# Patient Record
Sex: Male | Born: 1985 | Hispanic: Yes | Marital: Single | Smoking: Never smoker
Health system: Southern US, Community
[De-identification: ages and names within clinical notes are randomized; demographics above are authoritative.]

---
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
## 2008-10-05 IMAGING — CR DG SHOULDER 2+V*R*
3 series · 3 of 3 positions shown · non-contrast
Comparison: none

CLINICAL DATA: Trampoline injury with shoulder pain.

RIGHT SHOULDER - 3 VIEW

[w shoulder ap internal righ]
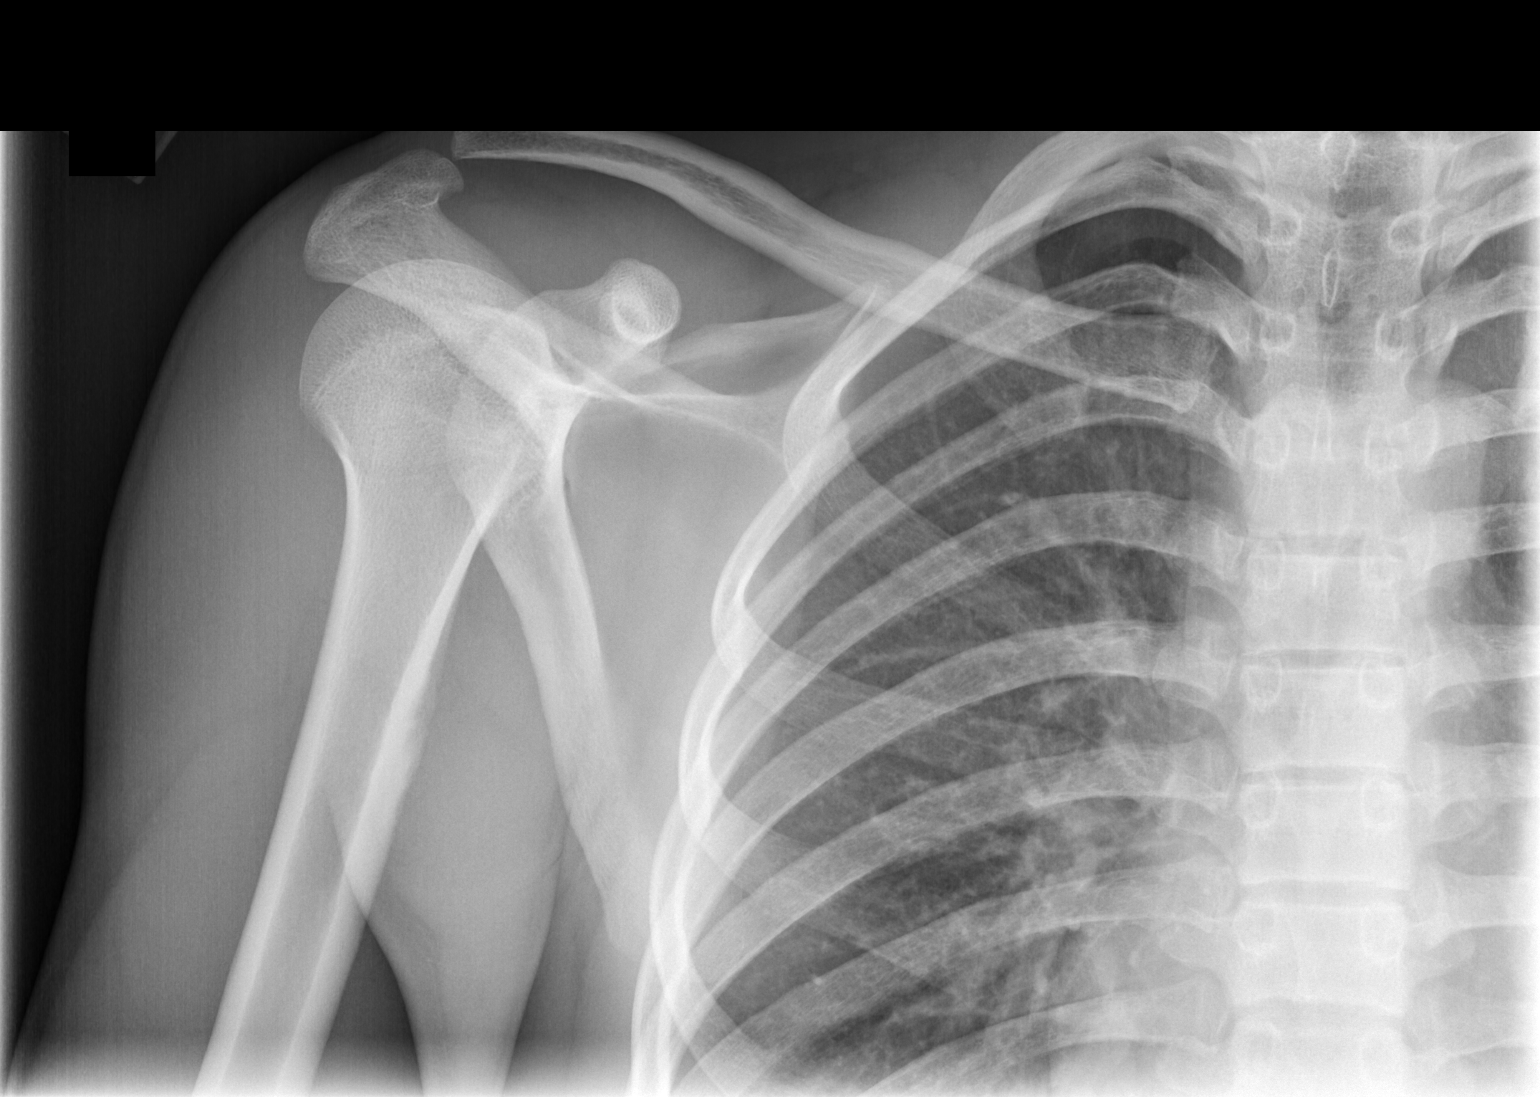

[w shoulder ap external righ]
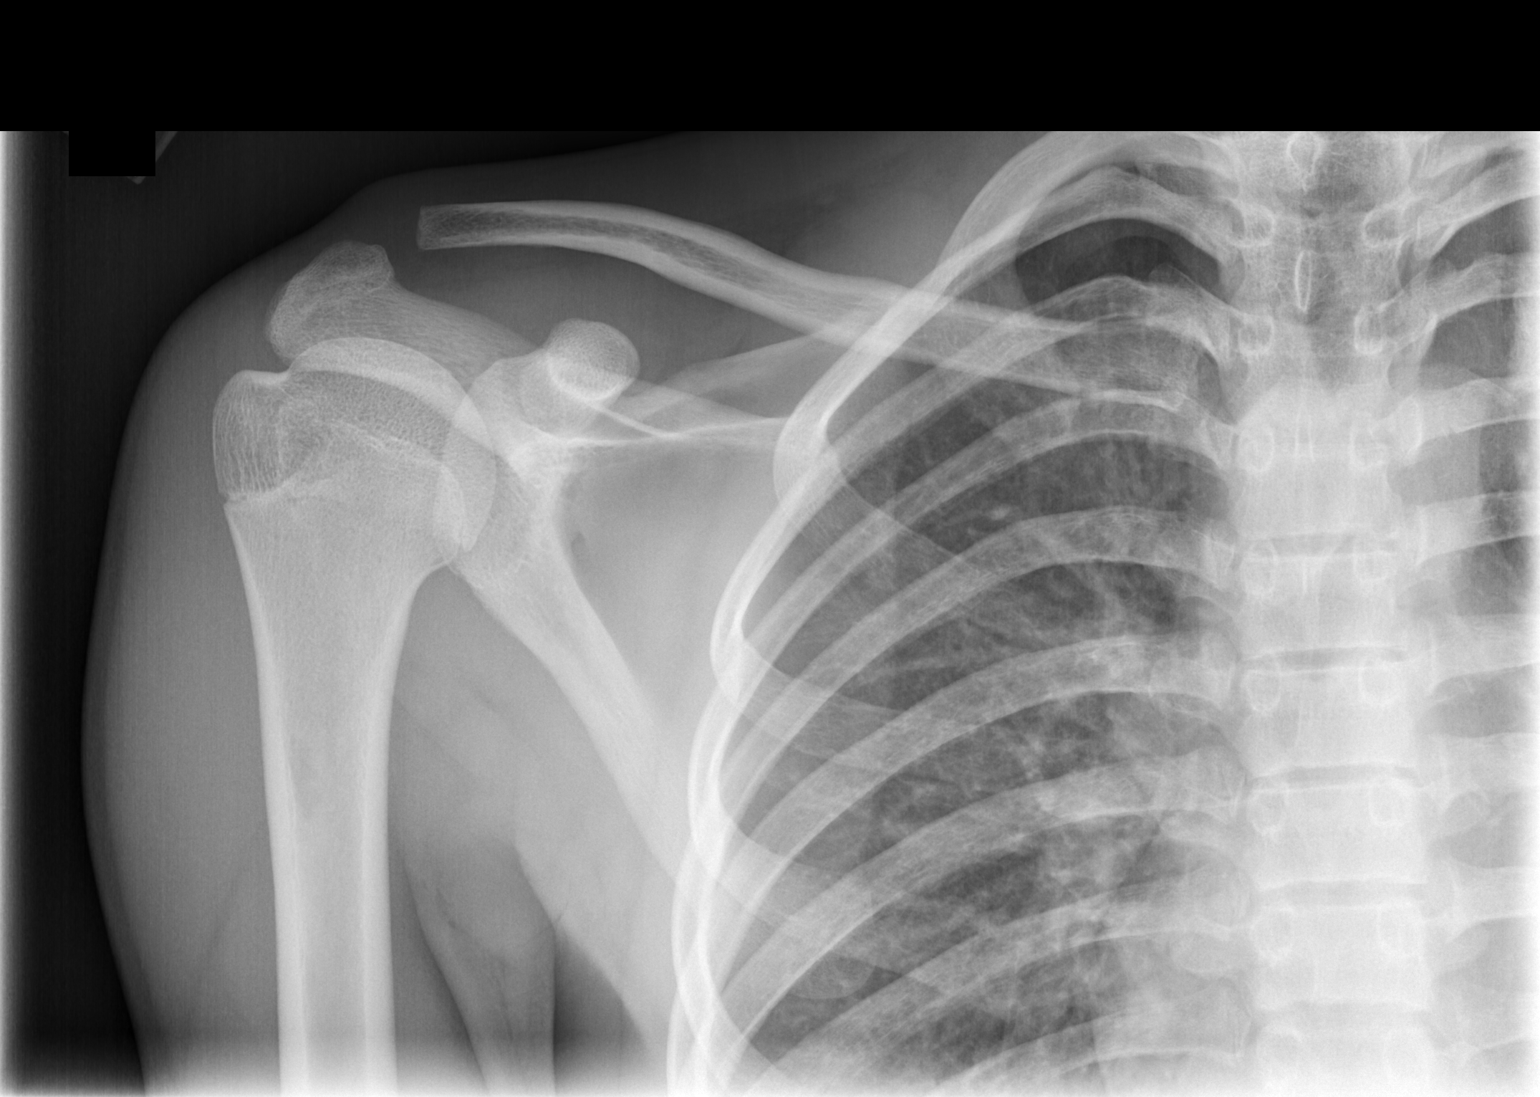

[w shoulder y view right]
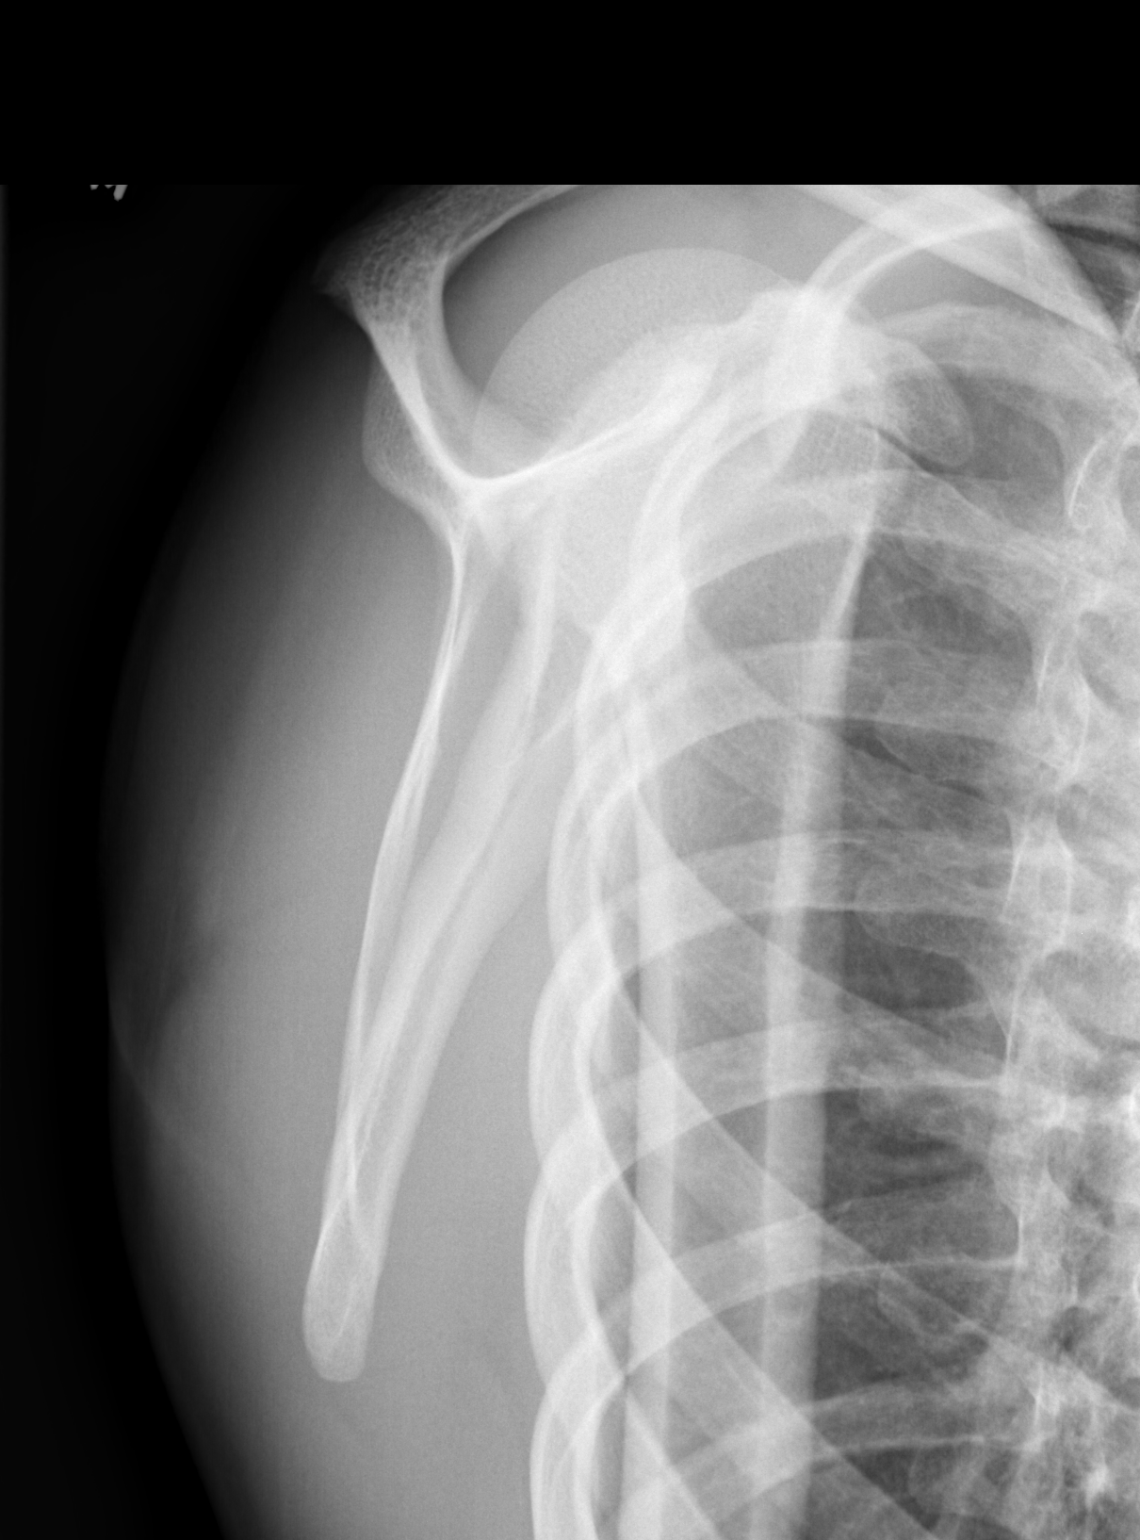

[3 of 3 positions shown; findings below may reference images not displayed]

FINDINGS: There appears to be an abnormal relationship between the clavicle and
the acromion, with the clavicle somewhat higher than the adjacent acromion. This
may reflect Bhebhe, Rantona, or IV separation. Weight-bearing views which
include the bilateral AC joints could be helpful for confirmation and further
characterization.

IMPRESSION

1. Poor alignment between the clavicle and acromion, compatible with AC joint
separation.  The coracoclavicular distances 14 mm, and thus I am uncertain
whether the coracoclavicular ligament is torn. Weight-bearing views may be
helpful for further evaluation, if clinically warranted. Correlate with physical
exam findings in assessing how posterior the distal clavicle is with respect the
acromion, to exclude Saihou Machiels IV separation.

## 2010-07-23 ENCOUNTER — Emergency Department (HOSPITAL_COMMUNITY)
Admission: EM | Admit: 2010-07-23 | Discharge: 2010-07-23 | Disposition: A | Discharge status: Home / Self Care | Payer: BC Managed Care – PPO | Attending: Emergency Medicine | Admitting: Emergency Medicine

## 2010-07-23 ENCOUNTER — Emergency Department (HOSPITAL_COMMUNITY): Payer: BC Managed Care – PPO

## 2010-07-23 DIAGNOSIS — S02109A Fracture of base of skull, unspecified side, initial encounter for closed fracture: Secondary | ICD-10-CM | POA: Insufficient documentation

## 2010-07-23 DIAGNOSIS — W219XXA Striking against or struck by unspecified sports equipment, initial encounter: Secondary | ICD-10-CM | POA: Insufficient documentation

## 2010-07-23 DIAGNOSIS — S0510XA Contusion of eyeball and orbital tissues, unspecified eye, initial encounter: Secondary | ICD-10-CM | POA: Insufficient documentation

## 2010-07-23 DIAGNOSIS — R040 Epistaxis: Secondary | ICD-10-CM | POA: Insufficient documentation

## 2010-07-23 DIAGNOSIS — H5710 Ocular pain, unspecified eye: Secondary | ICD-10-CM | POA: Insufficient documentation

## 2010-07-23 DIAGNOSIS — Y9366 Activity, soccer: Secondary | ICD-10-CM | POA: Insufficient documentation

## 2010-07-23 DIAGNOSIS — R220 Localized swelling, mass and lump, head: Secondary | ICD-10-CM | POA: Insufficient documentation

## 2010-07-23 DIAGNOSIS — Y9239 Other specified sports and athletic area as the place of occurrence of the external cause: Secondary | ICD-10-CM | POA: Insufficient documentation

## 2014-05-11 ENCOUNTER — Emergency Department (INDEPENDENT_AMBULATORY_CARE_PROVIDER_SITE_OTHER)
Admission: EM | Admit: 2014-05-11 | Discharge: 2014-05-11 | Disposition: A | Discharge status: Home / Self Care | Payer: Self-pay | Source: Home / Self Care | Attending: Family Medicine | Admitting: Family Medicine

## 2014-05-11 ENCOUNTER — Encounter (HOSPITAL_COMMUNITY): Payer: Self-pay | Admitting: Emergency Medicine

## 2014-05-11 DIAGNOSIS — J111 Influenza due to unidentified influenza virus with other respiratory manifestations: Secondary | ICD-10-CM

## 2014-05-11 DIAGNOSIS — R69 Illness, unspecified: Principal | ICD-10-CM

## 2014-05-11 MED ORDER — TRAMADOL HCL 50 MG PO TABS: 50.0000 mg | ORAL_TABLET | Freq: Every evening | ORAL | Status: AC | PRN

## 2014-05-11 MED ORDER — NAPROXEN 500 MG PO TABS: 500.0000 mg | ORAL_TABLET | Freq: Two times a day (BID) | ORAL | Status: AC

## 2014-05-11 NOTE — Discharge Instructions (Signed)
Thank you for coming in today. Call or go to the emergency room if you get worse, have trouble breathing, have chest pains, or palpitations.   Gripe (Influenza) La gripe es una infeccin viral del tracto respiratorio. Ocurre con ms frecuencia en los meses de invierno, ya que las personas pasan ms tiempo en contacto cercano. La gripe puede enfermarlo considerablemente. Se transmite fcilmente de [place] persona a otra (es contagiosa). CAUSAS  La causa es un virus que infecta el tracto respiratorio. Puede contagiarse el virus al aspirar las gotitas que una persona infectada elimina al toser o Engineering geologist. Tambin puede contagiarse al tocar algo que fue recientemente contaminado con el virus y [facility] mano a la boca, la nariz o los ojos. RIESGOS Y COMPLICACIONES Tendr mayor riesgo de sufrir un resfro grave si consume cigarrillos, es diabtico, sufre una enfermedad cardaca (como insuficiencia cardaca) o pulmonar crnica (como asma) o si tiene debilitado el sistema inmunolgico. Los ancianos y las mujeres embarazadas tienen ms riesgo de sufrir infecciones graves. El problema ms frecuente de la gripe es la infeccin pulmonar (neumona). En algunos casos, este problema puede requerir atencin mdica de emergencia y Biochemist, clinical en peligro la vida. Marnee Spring  Los sntomas pueden durar entre 4 y [address] y pueden ser:  Grant Ruts.  Escalofros.  Dolor de [place], dolores en el cuerpo y musculares.  Dolor de Advertising copywriter.  Molestias en el pecho y tos.  Prdida del apetito.  Debilidad o cansancio.  Mareos.  Nuseas o vmitos. DIAGNSTICO  El diagnstico se realiza segn su historia clnica y un examen fsico. Es necesario realizar un anlisis de cultivo farngeo o nasal para confirmar el diagnstico. TRATAMIENTO  En los casos leves, la gripe se cura sin TEFL teacher. El tratamiento est dirigido a Consulting civil engineer sntomas. En los casos ms graves, el mdico podr recetar medicamentos antivirales  para acortar el curso de la enfermedad. Los antibiticos no son eficaces, ya que la infeccin est causada por un virus y no una bacteria. INSTRUCCIONES PARA EL CUIDADO EN EL HOGAR  Tome los medicamentos solamente como se lo haya indicado el mdico.  Utilice un humidificador de niebla fra para facilitar la respiracin.  Haga reposo hasta que la temperatura vuelva a ser normal. Generalmente esto lleva entre 3 y [address]  Beba suficiente lquido para Photographer orina clara o de color amarillo plido.  Cbrase la boca y la nariz al toser o Engineering geologist, y [employer] manos muy bien para evitar que se propague el virus.  Lanny Hurst en su casa y no concurra al Aleen Campi o a la escuela hasta que la fiebre haya desaparecido al menos por un da completo. PREVENCIN  La vacunacin anual contra la gripe es la mejor manera de evitar enfermarse. Se recomienda ahora de manera rutinaria una vacuna anual contra la gripe a todos los [employer]. SOLICITE ATENCIN MDICA SI:  Tiene dolor en el pecho, la tos empeora o tiene ms mucosidad.  Tiene nuseas, vmitos o diarrea.  La fiebre regresa o empeora. SOLICITE ATENCIN MDICA DE INMEDIATO SI:   Tiene dificultad para respirar, le falta el aire o tiene la piel o las uas [place].  Presenta dolor intenso o entumecimiento en el cuello.  Le duele la cabeza de forma repentina o tiene dolor en la cara o el odo.  Tiene nuseas o vmitos que no puede controlar. ASEGRESE DE QUE:   Comprende estas instrucciones.  Controlar su afeccin.  Recibir ayuda de inmediato si no mejora o si empeora.  Document Released: 11/20/2004 Document Revised: 06/27/2013 [facility] Patient Information 2015 [place], [place]. This information is not intended to replace advice given to you by your health care provider. Make sure you discuss any questions you have with your health care provider.

## 2014-05-11 NOTE — ED Notes (Signed)
See provider's note

## 2014-05-11 NOTE — ED Provider Notes (Signed)
Glen Evans is a 29 y.o. male who presents to Urgent Care today for cough headache body ache and fatigue. Symptoms started yesterday. Patient notes fevers and chills. No vomiting or diarrhea. No trouble breathing. No treatment tried yet. His girlfriend recently tested positive for influenza.   No past medical history on file. No past surgical history on file. History  Substance Use Topics  . Smoking status: Not on file  . Smokeless tobacco: Not on file  . Alcohol Use: Not on file   ROS as above Medications: No current facility-administered medications for this encounter.   Current Outpatient Prescriptions  Medication Sig Dispense Refill  . naproxen (NAPROSYN) 500 MG tablet Take 1 tablet (500 mg total) by mouth 2 (two) times daily. spanish 30 tablet 0  . traMADol (ULTRAM) 50 MG tablet Take 1 tablet (50 mg total) by mouth at bedtime as needed (cough). spanish 7 tablet 0   Allergies not on file   Exam:  BP 125/84 mmHg  Pulse 77  Temp(Src) 99.5 F (37.5 C) (Oral)  Resp 16  SpO2 100% Gen: Well NAD nontoxic appearing HEENT: EOMI,  MMM clear nasal discharge. Normal tympanic membranes bilaterally. Normal posterior pharynx Lungs: Normal work of breathing. CTABL Heart: RRR no MRG Abd: NABS, Soft. Nondistended, Nontender Exts: Brisk capillary refill, warm and well perfused.   No results found for this or any previous visit (from the past 24 hour(s)). No results found.  Assessment and Plan: 29 y.o. male with influenza-like illness. Patient does not have insurance and cannot afford Tamiflu. Will treat with naproxen and tramadol for cough.  Discussed warning signs or symptoms. Please see discharge instructions. Patient expresses understanding.     Rodolph BongEvan S Aileena Iglesia, MD 05/11/14 [phone]

## 2015-09-11 ENCOUNTER — Encounter (HOSPITAL_COMMUNITY): Payer: Self-pay | Admitting: Emergency Medicine

## 2015-09-11 ENCOUNTER — Ambulatory Visit (HOSPITAL_COMMUNITY)
Admission: EM | Admit: 2015-09-11 | Discharge: 2015-09-11 | Disposition: A | Discharge status: Home / Self Care | Payer: Self-pay | Attending: Family Medicine | Admitting: Family Medicine

## 2015-09-11 DIAGNOSIS — R0789 Other chest pain: Secondary | ICD-10-CM

## 2015-09-11 MED ORDER — DICLOFENAC POTASSIUM 50 MG PO TABS
50.0000 mg | ORAL_TABLET | Freq: Three times a day (TID) | ORAL | Status: AC
Start: 2015-09-11 — End: ?

## 2015-09-11 NOTE — ED Provider Notes (Signed)
CSN: [id number]     Arrival date & time 09/11/15  1132 History   First MD Initiated Contact with Patient 09/11/15 1332     Chief Complaint  Patient presents with  . Chest Pain   (Consider location/radiation/quality/duration/timing/severity/associated sxs/prior Treatment) Patient is a 30 y.o. male presenting with chest pain. The history is provided by the patient and the spouse. The history is limited by a language barrier. A language interpreter was used (spouse trans.).  Chest Pain Pain location:  L lateral chest Pain quality: sharp   Pain radiates to:  Does not radiate Pain radiates to the back: no   Pain severity:  Mild Duration:  4 hours Progression:  Unchanged Chronicity:  New Context comment:  [place] [facility] [place] [facility] [place] [facility]. Relieved by:  None tried Worsened by:  Nothing tried Ineffective treatments:  None tried Associated symptoms: no back pain, no cough, no numbness, no orthopnea, no palpitations, no PND and no shortness of breath   Risk factors: not obese     History reviewed. No pertinent past medical history. History reviewed. No pertinent past surgical history. History reviewed. No pertinent family history. Social History  Substance Use Topics  . Smoking status: Never Smoker   . Smokeless tobacco: None  . Alcohol Use: No    Review of Systems  Constitutional: Negative.   HENT: Negative.   Respiratory: Negative for cough and shortness of breath.   Cardiovascular: Positive for chest pain. Negative for palpitations, orthopnea, leg swelling and PND.  Musculoskeletal: Negative.  Negative for back pain.  Skin: Negative.   Neurological: Negative for numbness.  All other systems reviewed and are negative.   Allergies  Review of patient's allergies indicates no known allergies.  Home Medications   Prior to Admission medications   Medication Sig Start Date End Date Taking? Authorizing Provider  diclofenac (CATAFLAM) 50 MG tablet Take 1 tablet (50 mg  total) by mouth 3 (three) times daily. 09/11/15   Linna HoffJames D Alawna Graybeal, MD  naproxen (NAPROSYN) 500 MG tablet Take 1 tablet (500 mg total) by mouth 2 (two) times daily. spanish 05/11/14   Rodolph BongEvan S Corey, MD  traMADol (ULTRAM) 50 MG tablet Take 1 tablet (50 mg total) by mouth at bedtime as needed (cough). spanish 05/11/14   Rodolph BongEvan S Corey, MD   Meds Ordered and Administered this Visit  Medications - No data to display  BP 131/75 mmHg  Pulse 50  Temp(Src) 98.1 F (36.7 C) (Oral)  Resp 16  SpO2 100% No data found.   Physical Exam  Constitutional: He is oriented to person, place, and time. He appears well-developed and well-nourished.  Cardiovascular: Normal rate, regular rhythm, normal heart sounds and intact distal pulses.   Pulmonary/Chest: Effort normal and breath sounds normal. He exhibits tenderness.  Abdominal: Soft. Bowel sounds are normal.  Neurological: He is alert and oriented to person, place, and time.  Skin: Skin is warm.  Nursing note and vitals reviewed.   ED Course  Procedures (including critical care time)  Labs Review Labs Reviewed - No data to display  Imaging Review No results found.   Visual Acuity Review  Right Eye Distance:   Left Eye Distance:   Bilateral Distance:    Right Eye Near:   Left Eye Near:    Bilateral Near:         MDM   1. Acute chest wall pain        Linna HoffJames D Tayvien Kane, MD 09/11/15 1429

## 2015-09-11 NOTE — ED Notes (Signed)
[facility] a [facility] left sided rib pain that increases when he takes a deep breath. The pain started this am when he lifted a heavy object at work.
